# Patient Record
Sex: Female | Born: 1975 | Race: Black or African American | Hispanic: No | Smoking: Former smoker
Health system: Southern US, Community
[De-identification: ages and names within clinical notes are randomized; demographics above are authoritative.]

---

## 2021-05-07 ENCOUNTER — Other Ambulatory Visit: Payer: Self-pay

## 2021-05-07 ENCOUNTER — Emergency Department (HOSPITAL_BASED_OUTPATIENT_CLINIC_OR_DEPARTMENT_OTHER)
Admission: EM | Admit: 2021-05-07 | Discharge: 2021-05-08 | Disposition: A | Discharge status: Home / Self Care | Payer: Medicaid Other | Attending: Emergency Medicine | Admitting: Emergency Medicine

## 2021-05-07 ENCOUNTER — Emergency Department (HOSPITAL_BASED_OUTPATIENT_CLINIC_OR_DEPARTMENT_OTHER): Payer: Medicaid Other

## 2021-05-07 DIAGNOSIS — M79601 Pain in right arm: Secondary | ICD-10-CM

## 2021-05-07 DIAGNOSIS — R0602 Shortness of breath: Secondary | ICD-10-CM | POA: Diagnosis not present

## 2021-05-07 DIAGNOSIS — Z91010 Allergy to peanuts: Secondary | ICD-10-CM | POA: Insufficient documentation

## 2021-05-07 DIAGNOSIS — L03113 Cellulitis of right upper limb: Secondary | ICD-10-CM | POA: Diagnosis not present

## 2021-05-07 DIAGNOSIS — R072 Precordial pain: Secondary | ICD-10-CM | POA: Diagnosis not present

## 2021-05-07 DIAGNOSIS — M79603 Pain in arm, unspecified: Secondary | ICD-10-CM

## 2021-05-07 MED ORDER — ASPIRIN 81 MG PO CHEW
324.0000 mg | CHEWABLE_TABLET | Freq: Once | ORAL | Status: AC
  Administered 2021-05-08: 324 mg via ORAL
  Filled 2021-05-07: qty 4

## 2021-05-07 NOTE — ED Notes (Signed)
Reassessed the Pt.due to c/o pain and burning in the R arm.  Noted edema just at the elbow where poss. Insect bite with redness noted as well.  Pt. In no resp. Distress.

## 2021-05-07 NOTE — ED Notes (Signed)
Pt. To ultrasound.

## 2021-05-07 NOTE — ED Notes (Signed)
Ice pk placed on Pt. Arm where Pt. Has c/o pain.

## 2021-05-07 NOTE — ED Triage Notes (Signed)
Pt states she thought she had a spider bite. Pain and swelling to right upper arm x 2 days. States also feels short of breath. NAD during triage

## 2021-05-08 ENCOUNTER — Other Ambulatory Visit (HOSPITAL_BASED_OUTPATIENT_CLINIC_OR_DEPARTMENT_OTHER): Payer: Self-pay

## 2021-05-08 LAB — CBC WITH DIFFERENTIAL/PLATELET
Abs Immature Granulocytes: 0.03 10*3/uL (ref 0.00–0.07)
Basophils Absolute: 0 10*3/uL (ref 0.0–0.1)
Basophils Relative: 0 %
Eosinophils Absolute: 0.4 10*3/uL (ref 0.0–0.5)
Eosinophils Relative: 5 %
HCT: 36.6 % (ref 36.0–46.0)
Hemoglobin: 11.3 g/dL — ABNORMAL LOW (ref 12.0–15.0)
Immature Granulocytes: 0 %
Lymphocytes Relative: 27 %
Lymphs Abs: 2.5 10*3/uL (ref 0.7–4.0)
MCH: 21.7 pg — ABNORMAL LOW (ref 26.0–34.0)
MCHC: 30.9 g/dL (ref 30.0–36.0)
MCV: 70.2 fL — ABNORMAL LOW (ref 80.0–100.0)
Monocytes Absolute: 0.7 10*3/uL (ref 0.1–1.0)
Monocytes Relative: 8 %
Neutro Abs: 5.5 10*3/uL (ref 1.7–7.7)
Neutrophils Relative %: 60 %
Platelets: 496 10*3/uL — ABNORMAL HIGH (ref 150–400)
RBC: 5.21 MIL/uL — ABNORMAL HIGH (ref 3.87–5.11)
RDW: 16.7 % — ABNORMAL HIGH (ref 11.5–15.5)
WBC: 9.3 10*3/uL (ref 4.0–10.5)
nRBC: 0 % (ref 0.0–0.2)

## 2021-05-08 LAB — BASIC METABOLIC PANEL
Anion gap: 8 (ref 5–15)
BUN: 10 mg/dL (ref 6–20)
CO2: 23 mmol/L (ref 22–32)
Calcium: 9.2 mg/dL (ref 8.9–10.3)
Chloride: 104 mmol/L (ref 98–111)
Creatinine, Ser: 0.81 mg/dL (ref 0.44–1.00)
GFR, Estimated: >60 mL/min (ref >60)
Glucose, Bld: 87 mg/dL (ref 70–99)
Potassium: 3.9 mmol/L (ref 3.5–5.1)
Sodium: 135 mmol/L (ref 135–145)

## 2021-05-08 LAB — TROPONIN I (HIGH SENSITIVITY)
Troponin I (High Sensitivity): <2 ng/L (ref <18)
Troponin I (High Sensitivity): <2 ng/L (ref <18)

## 2021-05-08 MED ORDER — DOXYCYCLINE HYCLATE 100 MG PO TABS
100.0000 mg | ORAL_TABLET | Freq: Once | ORAL | Status: AC
  Administered 2021-05-08: 100 mg via ORAL
  Filled 2021-05-08: qty 1

## 2021-05-08 MED ORDER — DOXYCYCLINE HYCLATE 100 MG PO CAPS
100.0000 mg | ORAL_CAPSULE | Freq: Two times a day (BID) | ORAL | 0 refills | Status: DC
  Filled 2021-05-08: qty 14, 7d supply, fill #0

## 2021-05-08 NOTE — ED Provider Notes (Signed)
[facility] EMERGENCY DEPARTMENT Provider Note   CSN: [id number] Arrival date & time: 05/07/21  1900     History Chief Complaint  Patient presents with   Arm Pain    Yesenia Smith is a 45 y.o. female.   Arm Pain Associated symptoms include chest pain and shortness of breath. Pertinent negatives include no headaches.   HPI: A 45 year old patient with a history of hypertension and obesity presents for evaluation of chest pain. Initial onset of pain was more than 6 hours ago. The patient's chest pain is described as heaviness/pressure/tightness and is not worse with exertion. The patient's chest pain is middle- or left-sided, is not well-localized, is not sharp and does not radiate to the arms/jaw/neck. The patient does not complain of nausea and denies diaphoresis. The patient has no history of stroke, has no history of peripheral artery disease, has not smoked in the past 90 days, denies any history of treated diabetes, has no relevant family history of coronary artery disease (first degree relative at less than age 55) and has no history of hypercholesterolemia.  Patient presents for 2 issues.  1.  Patient reports right arm pain and swelling.  She reports a week ago she woke up with redness and swelling that she thought was allergic reaction to a spider bite.  She took Benadryl with some improvement.  She suspects there are spiders throughout her apartment but has not seen one bite her.  She reports the pain and swelling returned over the past 24 hours.  No,.  No previous history of VTE.  2.  Patient reports episode of chest pain and pressure several hours prior to arrival.  She reports shortness of breath.  No fevers or vomiting.  No known history of CAD/VTE Reports recently been started on phentermine for weight loss.  No other medications.  No drug use is reported    PMH-none Fam hx - negative for CAD Soc hx - nonsmoker OB History   No obstetric history on file.      No family history on file.     Home Medications Prior to Admission medications   Medication Sig Start Date End Date Taking? Authorizing Provider  doxycycline (VIBRAMYCIN) 100 MG capsule Take 1 capsule (100 mg total) by mouth 2 (two) times daily. One po bid x 7 days 05/08/21  Yes Zadie Rhine, MD    Allergies    Acetaminophen, Peanut (diagnostic), and Penicillins  Review of Systems   Review of Systems  Constitutional:  Negative for fever.  Respiratory:  Positive for shortness of breath.   Cardiovascular:  Positive for chest pain.  Gastrointestinal:  Negative for vomiting.  Musculoskeletal:  Positive for joint swelling.  Skin:  Positive for color change.  Neurological:  Negative for weakness and headaches.  Psychiatric/Behavioral:  The patient is nervous/anxious.   All other systems reviewed and are negative.  Physical Exam Updated Vital Signs BP (!) 147/102 (BP Location: Left Arm)   Pulse 79   Temp 98.3 F (36.8 C) (Oral)   Resp 16   Ht 1.6 m (5\' 3" )   Wt 99.8 kg   LMP 04/10/2021 (Approximate)   SpO2 99%   BMI 38.97 kg/m   Physical Exam CONSTITUTIONAL: Well developed/well nourished, anxious HEAD: Normocephalic/atraumatic EYES: EOMI/PERRL ENMT: Mucous membranes moist NECK: supple no meningeal signs SPINE/BACK:entire spine nontender CV: S1/S2 noted, no murmurs/rubs/gallops noted LUNGS: Lungs are clear to auscultation bilaterally, no apparent distress ABDOMEN: soft, nontender, no rebound or guarding, bowel sounds noted throughout abdomen  GU:no cva tenderness NEURO: Pt is awake/alert/appropriate, moves all extremitiesx4.  No facial droop.  No focal weakness to the extremities EXTREMITIES: pulses normal/equal, full ROM Distal pulses equal and intact.  There is an area of erythema and induration just proximal to the right elbow.  Full range of motion of right elbow without difficulty.  No obvious signs of bursitis. Overall the arms appear symmetric SKIN: warm,  color normal PSYCH: Anxious   Patient gave verbal permission to utilize photo for medical documentation only The image was not stored on any personal device ED Results / Procedures / Treatments   Labs (all labs ordered are listed, but only abnormal results are displayed) Labs Reviewed  CBC WITH DIFFERENTIAL/PLATELET - Abnormal; Notable for the following components:      Result Value   RBC 5.21 (*)    Hemoglobin 11.3 (*)    MCV 70.2 (*)    MCH 21.7 (*)    RDW 16.7 (*)    Platelets 496 (*)    All other components within normal limits  BASIC METABOLIC PANEL  TROPONIN I (HIGH SENSITIVITY)  TROPONIN I (HIGH SENSITIVITY)    EKG EKG Interpretation  Date/Time:  Wednesday May 08 2021 00:11:40 EDT Ventricular Rate:  78 PR Interval:  151 QRS Duration: 88 QT Interval:  376 QTC Calculation: 429 R Axis:   33 Text Interpretation: Sinus rhythm No previous ECGs available Confirmed by Zadie Rhine ([id number]) on 05/08/2021 12:30:02 AM  Radiology DG Chest 2 View  Result Date: 05/08/2021 CLINICAL DATA:  Chest pain EXAM: CHEST - 2 VIEW COMPARISON:  None. FINDINGS: The heart size and mediastinal contours are within normal limits. Both lungs are clear. The visualized skeletal structures are unremarkable. IMPRESSION: No active cardiopulmonary disease. Electronically Signed   By: Alcide Clever M.D.   On: 05/08/2021 00:11   US Venous Img Upper Uni Right(DVT)  Result Date: 05/08/2021 CLINICAL DATA:  Right elbow swelling for 2 days, initial encounter EXAM: RIGHT UPPER EXTREMITY VENOUS DOPPLER ULTRASOUND TECHNIQUE: Gray-scale sonography with graded compression, as well as color Doppler and duplex ultrasound were performed to evaluate the upper extremity deep venous system from the level of the subclavian vein and including the jugular, axillary, basilic, radial, ulnar and upper cephalic vein. Spectral Doppler was utilized to evaluate flow at rest and with distal augmentation maneuvers. COMPARISON:   None. FINDINGS: Contralateral Subclavian Vein: Respiratory phasicity is normal and symmetric with the symptomatic side. No evidence of thrombus. Normal compressibility. Internal Jugular Vein: No evidence of thrombus. Normal compressibility, respiratory phasicity and response to augmentation. Subclavian Vein: No evidence of thrombus. Normal compressibility, respiratory phasicity and response to augmentation. Axillary Vein: No evidence of thrombus. Normal compressibility, respiratory phasicity and response to augmentation. Cephalic Vein: No evidence of thrombus. Normal compressibility, respiratory phasicity and response to augmentation. Basilic Vein: No evidence of thrombus. Normal compressibility, respiratory phasicity and response to augmentation. Brachial Veins: No evidence of thrombus. Normal compressibility, respiratory phasicity and response to augmentation. Radial Veins: No evidence of thrombus. Normal compressibility, respiratory phasicity and response to augmentation. Ulnar Veins: No evidence of thrombus. Normal compressibility, respiratory phasicity and response to augmentation. Venous Reflux:  None visualized. Other Findings: Mild edema in the posterior soft tissues of the elbow is seen. No focal fluid collection is noted. IMPRESSION: No evidence of DVT within the right upper extremity. Mild edema in the posterior soft tissues of the elbow in the area of clinical concern. Electronically Signed   By: Alcide Clever M.D.   On: 05/08/2021  00:11    Procedures Procedures   Medications Ordered in ED Medications  doxycycline (VIBRA-TABS) tablet 100 mg (has no administration in time range)  aspirin chewable tablet 324 mg (324 mg Oral Given 05/08/21 0018)    ED Course  I have reviewed the triage vital signs and the nursing notes.  Pertinent labs & imaging results that were available during my care of the patient were reviewed by me and considered in my medical decision making (see chart for details).     MDM Rules/Calculators/A&P HEAR Score: 2                         Patient presents w/multiple complaints.  For her right arm pain, we have excluded DVT by ultrasound which I personally reviewed. Patient showed me a photo from a week ago that appear to be allergic type reaction.  She now has erythema, warmth and induration.  Could be allergic versus infectious, but no abscess identified.  Will start on oral antibiotics for now and have her monitor this at home.  She can also continue Benadryl  As far as her chest pain, this is improved.  She has a low hear score and her work-up has  been unremarkable.  I have low suspicion for ACS/PE/dissection or other acute cardiopulmonary emergency. Patient is feeling improved and will be discharged home.  She is new to the area and will be referred to a primary care provider. We discussed strict return precautions Final Clinical Impression(s) / ED Diagnoses Final diagnoses:  Arm pain  Precordial pain  Right arm pain  Cellulitis of right upper extremity    Rx / DC Orders ED Discharge Orders          Ordered    doxycycline (VIBRAMYCIN) 100 MG capsule  2 times daily        05/08/21 0224             Zadie Rhine, MD 05/08/21 [phone]

## 2021-05-08 NOTE — ED Notes (Signed)
ED Provider at bedside. Provided results of ultrasound and cxr.

## 2021-05-08 NOTE — Discharge Instructions (Addendum)

## 2021-05-09 ENCOUNTER — Other Ambulatory Visit (HOSPITAL_BASED_OUTPATIENT_CLINIC_OR_DEPARTMENT_OTHER): Payer: Self-pay

## 2021-05-16 ENCOUNTER — Other Ambulatory Visit (HOSPITAL_BASED_OUTPATIENT_CLINIC_OR_DEPARTMENT_OTHER): Payer: Self-pay

## 2022-09-08 IMAGING — DX DG CHEST 2V
2 series · 2 of 2 positions shown · non-contrast
Comparison: None.

CLINICAL DATA: Chest pain

EXAM:
CHEST - 2 VIEW

[chest pa]
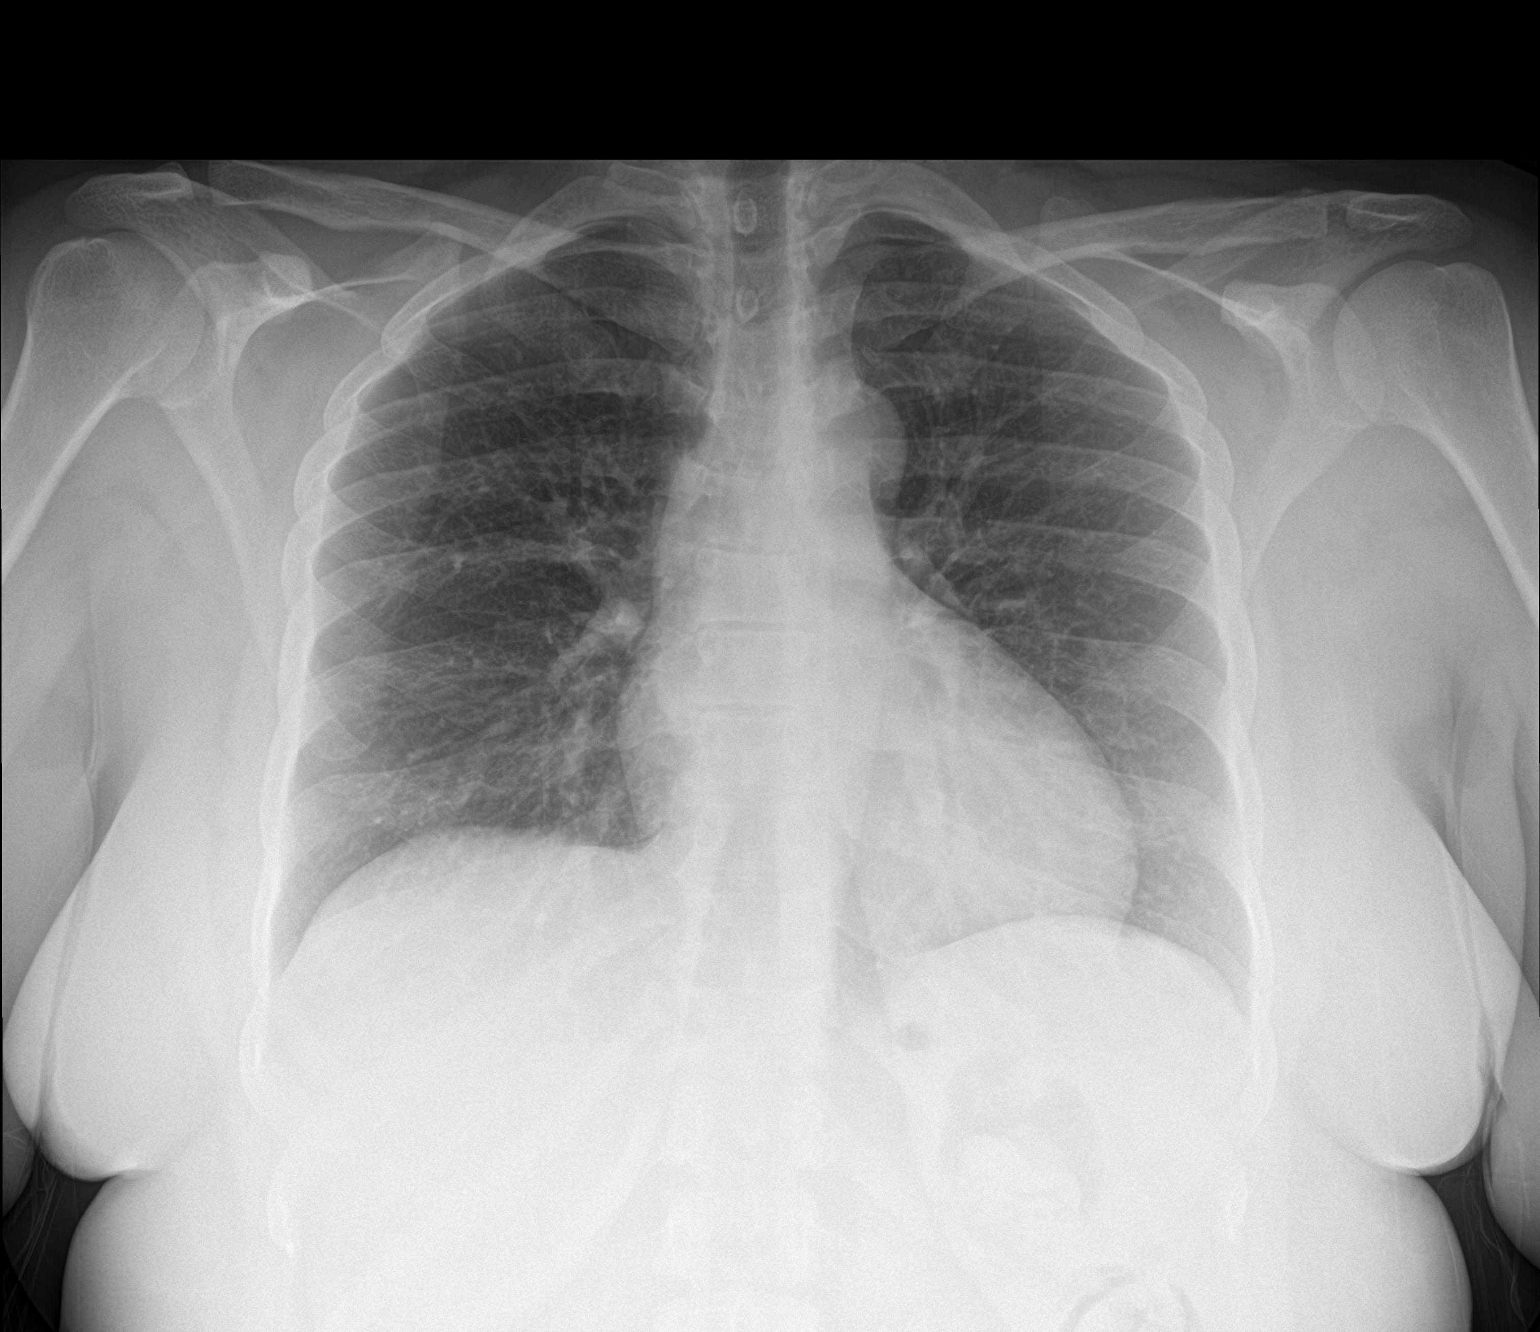

[chest lat]
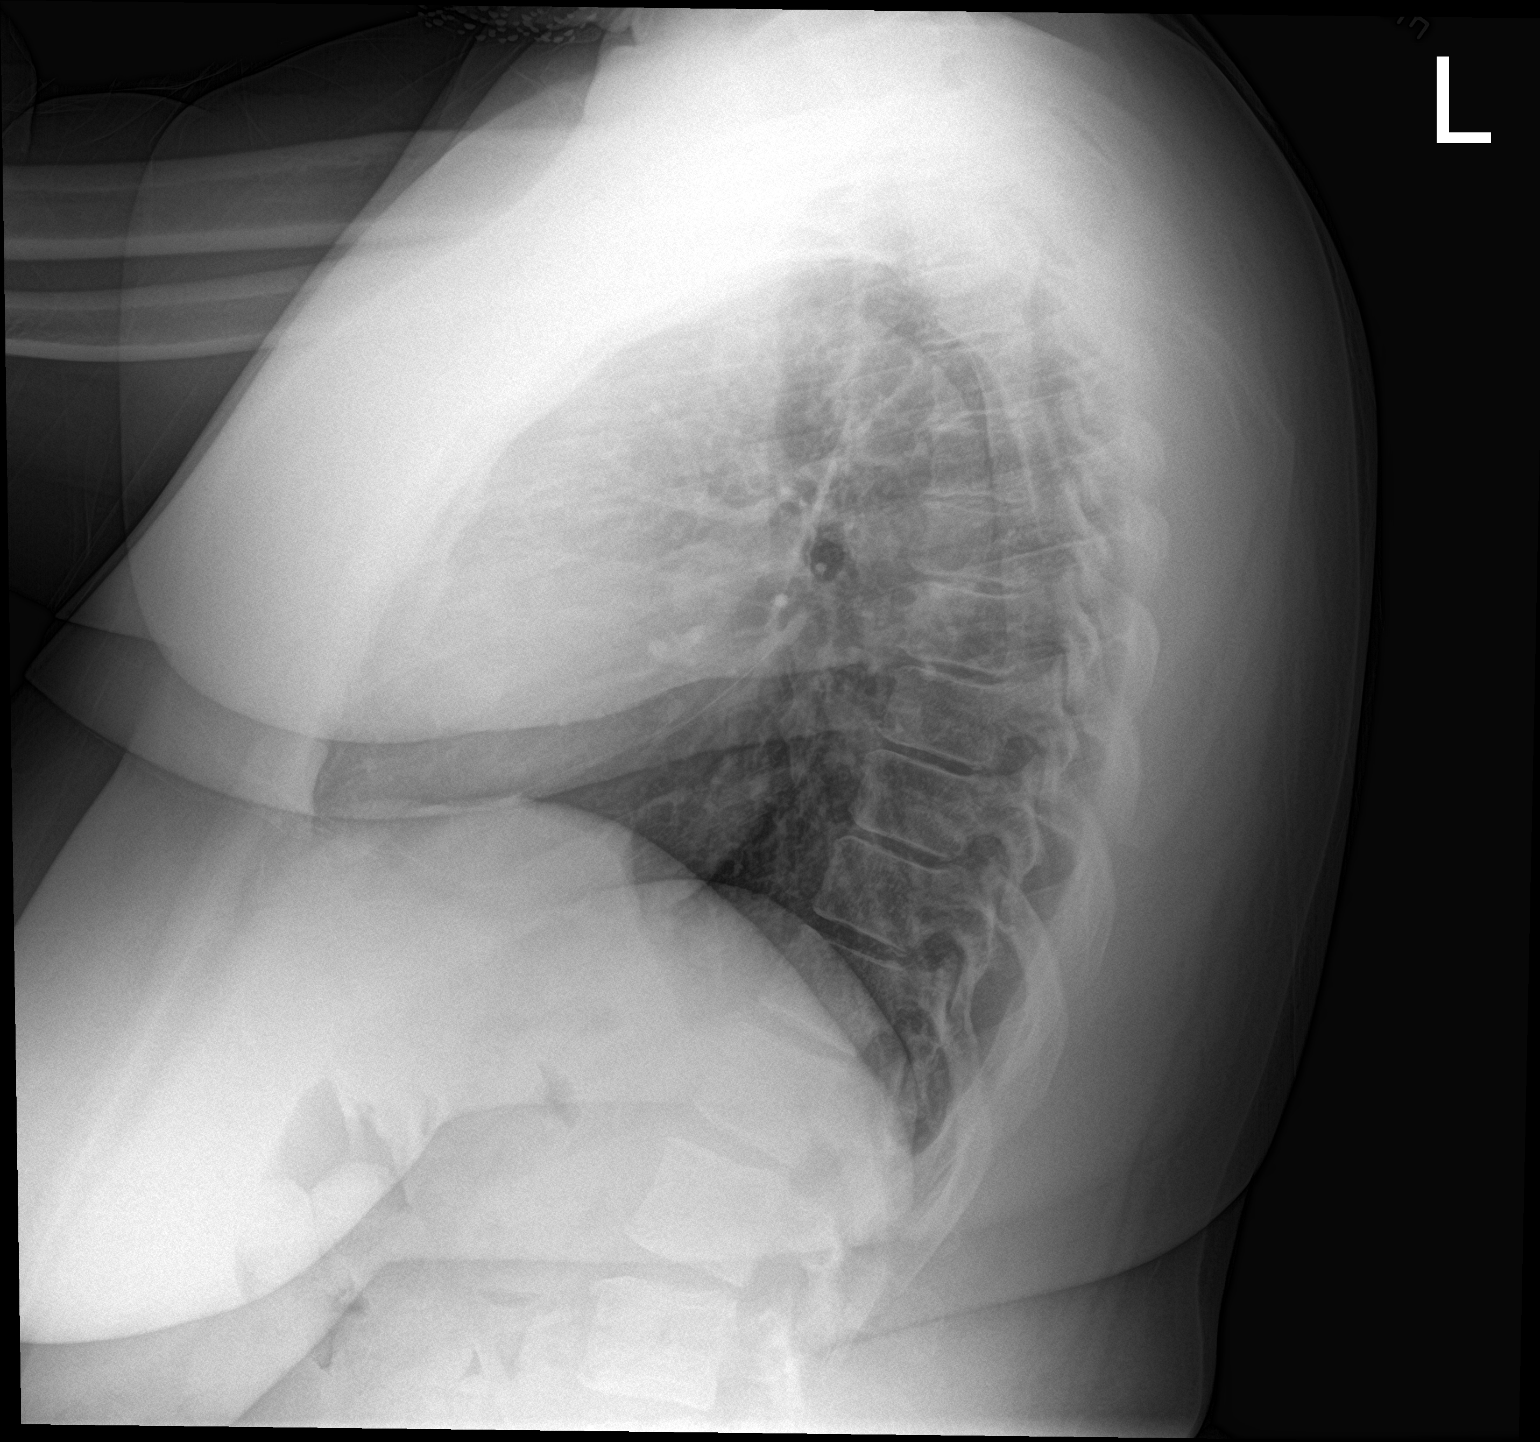

[2 of 2 positions shown; findings below may reference images not displayed]

FINDINGS: The heart size and mediastinal contours are within normal limits.
Both lungs are clear. The visualized skeletal structures are
unremarkable.
IMPRESSION: No active cardiopulmonary disease.

## 2023-04-13 ENCOUNTER — Encounter: Payer: Self-pay | Admitting: Internal Medicine

## 2023-04-22 ENCOUNTER — Ambulatory Visit
Admission: RE | Admit: 2023-04-22 | Discharge: 2023-04-22 | Disposition: A | Discharge status: Home / Self Care | Payer: Medicaid Other | Source: Ambulatory Visit | Attending: Internal Medicine | Admitting: Internal Medicine

## 2023-04-22 ENCOUNTER — Other Ambulatory Visit: Payer: Self-pay | Admitting: Internal Medicine

## 2023-04-22 DIAGNOSIS — M545 Low back pain, unspecified: Secondary | ICD-10-CM

## 2023-06-29 ENCOUNTER — Other Ambulatory Visit: Payer: Self-pay | Admitting: Registered Nurse

## 2023-06-29 DIAGNOSIS — R102 Pelvic and perineal pain: Secondary | ICD-10-CM

## 2023-07-14 ENCOUNTER — Ambulatory Visit
Admission: RE | Admit: 2023-07-14 | Discharge: 2023-07-14 | Disposition: A | Discharge status: Home / Self Care | Payer: Medicaid Other | Source: Ambulatory Visit | Attending: Registered Nurse | Admitting: Registered Nurse

## 2023-07-14 DIAGNOSIS — R102 Pelvic and perineal pain: Secondary | ICD-10-CM

## 2023-08-18 ENCOUNTER — Encounter: Payer: Self-pay | Admitting: Obstetrics and Gynecology

## 2023-08-18 ENCOUNTER — Ambulatory Visit: Payer: Medicaid Other | Admitting: Obstetrics and Gynecology

## 2023-08-18 VITALS — BP 135/85 | HR 80 | Ht 63.0 in | Wt 232.8 lb

## 2023-08-18 DIAGNOSIS — N888 Other specified noninflammatory disorders of cervix uteri: Secondary | ICD-10-CM

## 2023-08-18 DIAGNOSIS — N920 Excessive and frequent menstruation with regular cycle: Secondary | ICD-10-CM

## 2023-08-18 DIAGNOSIS — Z7689 Persons encountering health services in other specified circumstances: Secondary | ICD-10-CM | POA: Diagnosis not present

## 2023-08-18 NOTE — Progress Notes (Signed)
 HPI:      [name] is a 48 y.o. [phone] who LMP was Patient's last menstrual period was 08/17/2023.  Subjective:   She presents today with multiple issues and concerns.  She was recently told that she has nabothian cysts and that her endometrium was thickened.  She reports that she is a monthly menstruating woman who has heavy menstrual bleeding lasting approximately 8 days per cycle.  She reports her cycles as regular with no intermenstrual bleeding.  She recently had an ultrasound revealing the above findings. She complains of constant left hip pain.  She reports that she previously had multiple hernias fixed during an abdominal procedure through the laparoscope. She has done some research and she is concerned that she has either adhesions or endometriosis. She has had a cesarean delivery and four VBAC's.  She is not currently sexually active.    Hx: The following portions of the patient's history were reviewed and updated as appropriate:             She  has no past medical history on file. She does not have a problem list on file. She  has a past surgical history that includes Cesarean section. Her family history is not on file. She  reports that she has quit smoking. Her smoking use included cigarettes. She has never used smokeless tobacco. She reports that she does not currently use alcohol. She reports that she does not currently use drugs. She has a current medication list which includes the following prescription(s): telmisartan. She is allergic to acetaminophen, peanut (diagnostic), and penicillins.       Review of Systems:  Review of Systems  Constitutional: Denied constitutional symptoms, night sweats, recent illness, fatigue, fever, insomnia and weight loss.  Eyes: Denied eye symptoms, eye pain, photophobia, vision change and visual disturbance.  Ears/Nose/Throat/Neck: Denied ear, nose, throat or neck symptoms, hearing loss, nasal discharge, sinus congestion and sore  throat.  Cardiovascular: Denied cardiovascular symptoms, arrhythmia, chest pain/pressure, edema, exercise intolerance, orthopnea and palpitations.  Respiratory: Denied pulmonary symptoms, asthma, pleuritic pain, productive sputum, cough, dyspnea and wheezing.  Gastrointestinal: Denied, gastro-esophageal reflux, melena, nausea and vomiting.  Genitourinary: See HPI for additional information.  Musculoskeletal: Denied musculoskeletal symptoms, stiffness, swelling, muscle weakness and myalgia.  Dermatologic: Denied dermatology symptoms, rash and scar.  Neurologic: Denied neurology symptoms, dizziness, headache, neck pain and syncope.  Psychiatric: Denied psychiatric symptoms, anxiety and depression.  Endocrine: Denied endocrine symptoms including hot flashes and night sweats.   Meds:   Current Outpatient Medications on File Prior to Visit  Medication Sig Dispense Refill   telmisartan (MICARDIS) 20 MG tablet Take 20 mg by mouth daily.     No current facility-administered medications on file prior to visit.      Objective:     Vitals:   08/18/23 1032 08/18/23 1039  BP: (!) 129/93 135/85  Pulse: 80 80   Filed Weights   08/18/23 1032  Weight: 232 lb 12.8 oz (105.6 kg)                        Assessment:    [id number] There are no active problems to display for this patient.    1. Establishing care with new doctor, encounter for   2. Nabothian cyst   3. Menorrhagia with regular cycle     Based on her history I think it is possible that she has adenomyosis.  She may well also have endometriosis or adhesions from prior  surgery.   Plan:            1.  We have discussed multiple options for her care.  At this time we have both chosen the best easiest option that is likely to help her with her menorrhagia/adenomyosis -IUD.  She has had an IUD before.  We have also discussed the option of Myfembree use, OCP use, and possible hysterectomy.  It is also possible that her hip pain  is indeed secondary to an orthopedic issue in her hip.   She will return for IUD during her menses. Orders No orders of the defined types were placed in this encounter.   No orders of the defined types were placed in this encounter.     F/U  Return in about 2 days (around 08/20/2023). I spent 46 minutes involved in the care of this patient preparing to see the patient by obtaining and reviewing her medical history (including labs, imaging tests and prior procedures), documenting clinical information in the electronic health record (EHR), counseling and coordinating care plans, writing and sending prescriptions, ordering tests or procedures and in direct communicating with the patient and medical staff discussing pertinent items from her history and physical exam.  Alm DOROTHA Sar, M.D. 08/18/2023 11:28 AM

## 2023-08-18 NOTE — Progress Notes (Signed)
 Patient presents today to discuss recent ultrasound. She states having left sided pain daily for the last two years along with heavy cycles. Patient states wanting to discuss endometriosis today. No additional concerns.

## 2023-08-20 ENCOUNTER — Ambulatory Visit: Payer: Medicaid Other | Admitting: Obstetrics and Gynecology

## 2023-08-20 ENCOUNTER — Encounter: Payer: Self-pay | Admitting: Obstetrics and Gynecology

## 2023-08-20 VITALS — BP 127/90 | HR 86 | Ht 63.0 in | Wt 230.0 lb

## 2023-08-20 DIAGNOSIS — Z30430 Encounter for insertion of intrauterine contraceptive device: Secondary | ICD-10-CM

## 2023-08-20 MED ORDER — LEVONORGESTREL 20 MCG/DAY IU IUD
1.0000 | INTRAUTERINE_SYSTEM | Freq: Once | INTRAUTERINE | Status: AC
  Administered 2023-08-20: 1 via INTRAUTERINE

## 2023-08-20 NOTE — Progress Notes (Signed)
Patient presents today for IUD insertion. She states no other questions or concerns.   

## 2023-08-20 NOTE — Progress Notes (Signed)
HPI:      [name] is a 48 y.o. [phone] who LMP was Patient's last menstrual period was 08/17/2023.  Subjective:   She presents today for IUD insertion.  She has been having very heavy menstrual bleeding and would like cycle control.  She is not currently sexually active.    Hx: The following portions of the patient's history were reviewed and updated as appropriate:             She  has no past medical history on file. She does not have a problem list on file. She  has a past surgical history that includes Cesarean section. Her family history is not on file. She  reports that she has quit smoking. Her smoking use included cigarettes. She has never used smokeless tobacco. She reports that she does not currently use alcohol. She reports that she does not currently use drugs. She has a current medication list which includes the following prescription(s): telmisartan. She is allergic to acetaminophen, peanut (diagnostic), and penicillins.       Review of Systems:  Review of Systems  Constitutional: Denied constitutional symptoms, night sweats, recent illness, fatigue, fever, insomnia and weight loss.  Eyes: Denied eye symptoms, eye pain, photophobia, vision change and visual disturbance.  Ears/Nose/Throat/Neck: Denied ear, nose, throat or neck symptoms, hearing loss, nasal discharge, sinus congestion and sore throat.  Cardiovascular: Denied cardiovascular symptoms, arrhythmia, chest pain/pressure, edema, exercise intolerance, orthopnea and palpitations.  Respiratory: Denied pulmonary symptoms, asthma, pleuritic pain, productive sputum, cough, dyspnea and wheezing.  Gastrointestinal: Denied, gastro-esophageal reflux, melena, nausea and vomiting.  Genitourinary: Denied genitourinary symptoms including symptomatic vaginal discharge, pelvic relaxation issues, and urinary complaints.  Musculoskeletal: Denied musculoskeletal symptoms, stiffness, swelling, muscle weakness and myalgia.   Dermatologic: Denied dermatology symptoms, rash and scar.  Neurologic: Denied neurology symptoms, dizziness, headache, neck pain and syncope.  Psychiatric: Denied psychiatric symptoms, anxiety and depression.  Endocrine: Denied endocrine symptoms including hot flashes and night sweats.   Meds:   Current Outpatient Medications on File Prior to Visit  Medication Sig Dispense Refill   telmisartan (MICARDIS) 20 MG tablet Take 20 mg by mouth daily.     No current facility-administered medications on file prior to visit.    Objective:     Vitals:   08/20/23 1403 08/20/23 1417  BP: (!) 143/90 (!) 127/90  Pulse: 86     Physical examination   Pelvic:   Vulva: Normal appearance.  No lesions.  Vagina: No lesions or abnormalities noted.  Support: Normal pelvic support.  Urethra No masses tenderness or scarring.  Meatus Normal size without lesions or prolapse.  Cervix: Normal appearance.  No lesions.  Anus: Normal exam.  No lesions.  Perineum: Normal exam.  No lesions.        Bimanual   Uterus: Normal size.  Non-tender.  Mobile.  AV.  Adnexae: No masses.  Non-tender to palpation.  Cul-de-sac: Negative for abnormality.   IUD Procedure Pt has read the booklet and signed the appropriate forms regarding the Mirena IUD.  All of her questions have been answered.   The cervix was cleansed with betadine solution.  After sounding the uterus and noting the position, the IUD was placed in the usual manner without problem.  The string was cut to the appropriate length.  The patient tolerated the procedure well.              Assessment:    [id number] There are no active problems to display for this  patient.    1. Encounter for IUD insertion       Plan:             F/U  Return in about 4 weeks (around 09/17/2023) for For IUD f/u.  Elonda Husky, M.D. 08/20/2023 2:23 PM

## 2023-09-16 ENCOUNTER — Encounter: Payer: Self-pay | Admitting: Obstetrics and Gynecology

## 2023-09-16 ENCOUNTER — Ambulatory Visit: Payer: Medicaid Other | Admitting: Obstetrics and Gynecology

## 2023-09-16 VITALS — BP 137/81 | HR 91 | Ht 63.0 in | Wt 234.4 lb

## 2023-09-16 DIAGNOSIS — N898 Other specified noninflammatory disorders of vagina: Secondary | ICD-10-CM | POA: Diagnosis not present

## 2023-09-16 DIAGNOSIS — Z30431 Encounter for routine checking of intrauterine contraceptive device: Secondary | ICD-10-CM

## 2023-09-16 NOTE — Progress Notes (Signed)
Patient presents today for IUD string check. She states no longer bleeding but noticing a foul smelling discharge. Patient reports no pain or discomfort. No other questions or concerns.

## 2023-09-16 NOTE — Progress Notes (Signed)
HPI:      [name] is a 48 y.o. [phone] who LMP was Patient's last menstrual period was 08/17/2023.  Subjective:   She presents today for IUD follow-up.  She reports that after having the IUD placed she has already noticed a difference in her bleeding patterns and her comfort level.  She has passed some small clots and has had some bleeding but nothing like her previous menses. She is not sexually active.    Hx: The following portions of the patient's history were reviewed and updated as appropriate:             She  has no past medical history on file. She does not have a problem list on file. She  has a past surgical history that includes Cesarean section. Her family history is not on file. She  reports that she has quit smoking. Her smoking use included cigarettes. She has never used smokeless tobacco. She reports that she does not currently use alcohol. She reports that she does not currently use drugs. She has a current medication list which includes the following prescription(s): levonorgestrel and telmisartan. She is allergic to acetaminophen, peanut (diagnostic), and penicillins.       Review of Systems:  Review of Systems  Constitutional: Denied constitutional symptoms, night sweats, recent illness, fatigue, fever, insomnia and weight loss.  Eyes: Denied eye symptoms, eye pain, photophobia, vision change and visual disturbance.  Ears/Nose/Throat/Neck: Denied ear, nose, throat or neck symptoms, hearing loss, nasal discharge, sinus congestion and sore throat.  Cardiovascular: Denied cardiovascular symptoms, arrhythmia, chest pain/pressure, edema, exercise intolerance, orthopnea and palpitations.  Respiratory: Denied pulmonary symptoms, asthma, pleuritic pain, productive sputum, cough, dyspnea and wheezing.  Gastrointestinal: Denied, gastro-esophageal reflux, melena, nausea and vomiting.  Genitourinary: Denied genitourinary symptoms including symptomatic vaginal discharge,  pelvic relaxation issues, and urinary complaints.  Musculoskeletal: Denied musculoskeletal symptoms, stiffness, swelling, muscle weakness and myalgia.  Dermatologic: Denied dermatology symptoms, rash and scar.  Neurologic: Denied neurology symptoms, dizziness, headache, neck pain and syncope.  Psychiatric: Denied psychiatric symptoms, anxiety and depression.  Endocrine: Denied endocrine symptoms including hot flashes and night sweats.   Meds:   Current Outpatient Medications on File Prior to Visit  Medication Sig Dispense Refill   levonorgestrel (MIRENA) 20 MCG/DAY IUD 1 each by Intrauterine route once.     telmisartan (MICARDIS) 20 MG tablet Take 20 mg by mouth daily.     No current facility-administered medications on file prior to visit.      Objective:     Vitals:   09/16/23 1015 09/16/23 1100  BP: (!) 134/90 137/81  Pulse: 91    Filed Weights   09/16/23 1015  Weight: 234 lb 6.4 oz (106.3 kg)              Physical examination   Pelvic:   Vulva: Normal appearance.  No lesions.  Vagina: No lesions or abnormalities noted.  Support: Normal pelvic support.  Urethra No masses tenderness or scarring.  Meatus Normal size without lesions or prolapse.  Cervix: Normal appearance.  No lesions. IUD strings noted at cervical os.  Anus: Normal exam.  No lesions.  Perineum: Normal exam.  No lesions.        Bimanual   Uterus: Normal size.  Non-tender.  Mobile.  AV.  Adnexae: No masses.  Non-tender to palpation.  Cul-de-sac: Negative for abnormality.             Assessment:    [id number] There are no active problems  to display for this patient.    1. Encounter for routine checking of intrauterine contraceptive device (IUD)   2. Vaginal discharge   3. Vaginal odor     Doing well with her IUD.  No problems   Plan:            1.  Expectant management.  Expect menses to be significantly reduced and give her amenorrhea or at least very good light cycles. Orders No orders  of the defined types were placed in this encounter.   No orders of the defined types were placed in this encounter.     F/U  Return for Annual Physical.  Elonda Husky, M.D. 09/16/2023 11:12 AM

## 2024-01-08 ENCOUNTER — Telehealth: Payer: Self-pay

## 2024-01-08 DIAGNOSIS — N921 Excessive and frequent menstruation with irregular cycle: Secondary | ICD-10-CM

## 2024-01-08 NOTE — Telephone Encounter (Signed)
 Patient calling in stating she is experiencing breakthrough bleeding with her IUD. Her IUD was placed in January and then began bleeding in March until now. She states the bleeding starts and stops randomly and varies between moderate to heavy with clots. Denies any pelvic pain. Advised patient Dr. Luster Salters is not in office until Tuesday and she states she is okay to wait until then for his recommendation. Advised I will follow-up with her Tuesday.

## 2024-01-12 MED ORDER — NORETHIN ACE-ETH ESTRAD-FE 1.5-30 MG-MCG PO TABS: 1.0000 | ORAL_TABLET | Freq: Every day | ORAL | 0 refills | Status: AC

## 2024-01-12 NOTE — Telephone Encounter (Signed)
 Spoke with Dr. Luster Salters regarding pt concerns. He recommends one month worth of OCP's at this time, follow-up on bleeding for 1-2 months and is bleeding persists pt will need an ultrasound. Patient has been made aware and OCP's have been sent to her pharmacy.

## 2024-01-12 NOTE — Addendum Note (Signed)
 Addended by: Woody Heading R on: 01/12/2024 11:41 AM   Modules accepted: Orders

## 2024-01-13 NOTE — Telephone Encounter (Signed)
 Patient called in stating her pharmacist advised not taking the OCP due to history of hypertension. She stated frustration with the IUD and at this time would like it removed. Scheduled for 6/13

## 2024-01-15 ENCOUNTER — Other Ambulatory Visit (HOSPITAL_BASED_OUTPATIENT_CLINIC_OR_DEPARTMENT_OTHER): Payer: Self-pay

## 2024-01-15 ENCOUNTER — Ambulatory Visit: Admitting: Obstetrics and Gynecology

## 2024-01-15 ENCOUNTER — Encounter: Payer: Self-pay | Admitting: Obstetrics and Gynecology

## 2024-01-15 VITALS — BP 127/83 | HR 88 | Ht 63.0 in | Wt 221.0 lb

## 2024-01-15 DIAGNOSIS — Z30432 Encounter for removal of intrauterine contraceptive device: Secondary | ICD-10-CM | POA: Diagnosis not present

## 2024-01-15 DIAGNOSIS — N921 Excessive and frequent menstruation with irregular cycle: Secondary | ICD-10-CM

## 2024-01-15 MED ORDER — SLYND 4 MG PO TABS
1.0000 | ORAL_TABLET | Freq: Every day | ORAL | 3 refills | Status: AC
Start: 2024-01-15 — End: ?
  Filled 2024-01-15: qty 84, 84d supply, fill #0

## 2024-01-15 NOTE — Progress Notes (Signed)
 Patient presents today for IUD removal. Reports abnormal heavy bleeding since March and she would rather not use anything for birth control at this time. She states no other questions or concerns.

## 2024-01-15 NOTE — Progress Notes (Signed)
 HPI:      [name] is a 48 y.o. [phone] who LMP was No LMP recorded. (Menstrual status: IUD).  Subjective:   She presents today with complaint that her IUD is no longer working for her.  She has bleeding almost daily off and on since her 4-week visit after IUD insertion.  She has had prior abdominal surgery for hernia a cesarean delivery and 4 VBAC's.  She is concerned that she may have adenomyosis , endometriosis or pelvic adhesive disease. At this time she states she would rather have regular heavy cycles that bleed intermittently as she is now.  She would like her IUD removed. She is not sexually active at this time.    Hx: The following portions of the patient's history were reviewed and updated as appropriate:             She  has no past medical history on file. She does not have a problem list on file. She  has a past surgical history that includes Cesarean section. Her family history is not on file. She  reports that she has quit smoking. Her smoking use included cigarettes. She has never used smokeless tobacco. She reports that she does not currently use alcohol. She reports that she does not currently use drugs. She has a current medication list which includes the following prescription(s): slynd, levonorgestrel , telmisartan, and norethindrone-ethinyl estradiol-iron. She is allergic to acetaminophen, peanut (diagnostic), and penicillins.       Review of Systems:  Review of Systems  Constitutional: Denied constitutional symptoms, night sweats, recent illness, fatigue, fever, insomnia and weight loss.  Eyes: Denied eye symptoms, eye pain, photophobia, vision change and visual disturbance.  Ears/Nose/Throat/Neck: Denied ear, nose, throat or neck symptoms, hearing loss, nasal discharge, sinus congestion and sore throat.  Cardiovascular: Denied cardiovascular symptoms, arrhythmia, chest pain/pressure, edema, exercise intolerance, orthopnea and palpitations.  Respiratory:  Denied pulmonary symptoms, asthma, pleuritic pain, productive sputum, cough, dyspnea and wheezing.  Gastrointestinal: Denied, gastro-esophageal reflux, melena, nausea and vomiting.  Genitourinary: Denied genitourinary symptoms including symptomatic vaginal discharge, pelvic relaxation issues, and urinary complaints.  Musculoskeletal: Denied musculoskeletal symptoms, stiffness, swelling, muscle weakness and myalgia.  Dermatologic: Denied dermatology symptoms, rash and scar.  Neurologic: Denied neurology symptoms, dizziness, headache, neck pain and syncope.  Psychiatric: Denied psychiatric symptoms, anxiety and depression.  Endocrine: Denied endocrine symptoms including hot flashes and night sweats.   Meds:   Current Outpatient Medications on File Prior to Visit  Medication Sig Dispense Refill   levonorgestrel  (MIRENA ) 20 MCG/DAY IUD 1 each by Intrauterine route once.     telmisartan (MICARDIS) 20 MG tablet Take 20 mg by mouth daily.     norethindrone-ethinyl estradiol-iron (LOESTRIN FE 1.5/30) 1.5-30 MG-MCG tablet Take 1 tablet by mouth at bedtime for 28 days. 28 tablet 0   No current facility-administered medications on file prior to visit.      Objective:     Vitals:   01/15/24 0850  BP: 127/83  Pulse: 88   Filed Weights   01/15/24 0850  Weight: 221 lb (100.2 kg)              Physical examination   Pelvic:   Vulva: Normal appearance.  No lesions.  Vagina: No lesions or abnormalities noted.  Support: Normal pelvic support.  Urethra No masses tenderness or scarring.  Meatus Normal size without lesions or prolapse.  Cervix: Normal appearance.  No lesions. IUD strings noted at cervical os.  Anus: Normal exam.  No lesions.  Perineum: Normal exam.  No lesions.        Bimanual   Uterus: Normal size.  Non-tender.  Mobile.  AV.  Adnexae: No masses.  Non-tender to palpation.  Cul-de-sac: Negative for abnormality.   IUD Removal Strings of IUD identified and grasped.  IUD  removed without problem.  Pt tolerated this well.  IUD noted to be intact.            Assessment:    [id number] There are no active problems to display for this patient.    1. Encounter for IUD removal   2. Breakthrough bleeding associated with intrauterine device (IUD)     Heavy menstrual bleeding monthly with pain/dysmenorrhea   Plan:            1.  We have discussed multiple options and at this time patient desires Slynd for possible cycle control.  Orders No orders of the defined types were placed in this encounter.    Meds ordered this encounter  Medications   Drospirenone (SLYND) 4 MG TABS    Sig: Take 1 tablet (4 mg total) by mouth daily.    Dispense:  84 tablet    Refill:  3      F/U  Return for Pt to contact us  if symptoms worsen, Annual Physical.  Delice Felt, M.D. 01/15/2024 9:26 AM

## 2024-01-26 ENCOUNTER — Other Ambulatory Visit (HOSPITAL_BASED_OUTPATIENT_CLINIC_OR_DEPARTMENT_OTHER): Payer: Self-pay

## 2024-10-19 ENCOUNTER — Ambulatory Visit: Payer: Self-pay | Admitting: Family

## 2024-10-25 ENCOUNTER — Ambulatory Visit: Admitting: Family Medicine
